# Patient Record
Sex: Male | Born: 1995 | Race: White | Hispanic: No | Marital: Single | Smoking: Current every day smoker
Health system: Southern US, Community
[De-identification: ages and names within clinical notes are randomized; demographics above are authoritative.]

---
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
## 2017-11-03 ENCOUNTER — Emergency Department (HOSPITAL_COMMUNITY): Payer: PRIVATE HEALTH INSURANCE

## 2017-11-03 ENCOUNTER — Other Ambulatory Visit: Payer: Self-pay

## 2017-11-03 ENCOUNTER — Emergency Department (HOSPITAL_COMMUNITY)
Admission: EM | Admit: 2017-11-03 | Discharge: 2017-11-03 | Disposition: A | Discharge status: Home / Self Care | Payer: PRIVATE HEALTH INSURANCE | Attending: Emergency Medicine | Admitting: Emergency Medicine

## 2017-11-03 ENCOUNTER — Encounter (HOSPITAL_COMMUNITY): Payer: Self-pay

## 2017-11-03 DIAGNOSIS — N50812 Left testicular pain: Secondary | ICD-10-CM | POA: Diagnosis present

## 2017-11-03 DIAGNOSIS — F17200 Nicotine dependence, unspecified, uncomplicated: Secondary | ICD-10-CM | POA: Diagnosis not present

## 2017-11-03 DIAGNOSIS — N451 Epididymitis: Secondary | ICD-10-CM

## 2017-11-03 LAB — URINALYSIS, ROUTINE W REFLEX MICROSCOPIC
Bacteria, UA: NONE SEEN
Bilirubin Urine: NEGATIVE
GLUCOSE, UA: NEGATIVE mg/dL
Hgb urine dipstick: NEGATIVE
KETONES UR: NEGATIVE mg/dL
Nitrite: NEGATIVE
PH: 5 (ref 5.0–8.0)
PROTEIN: NEGATIVE mg/dL
Specific Gravity, Urine: 1.025 (ref 1.005–1.030)

## 2017-11-03 MED ORDER — AZITHROMYCIN 250 MG PO TABS
1000.0000 mg | ORAL_TABLET | Freq: Once | ORAL | Status: AC
  Administered 2017-11-03: 1000 mg via ORAL
  Filled 2017-11-03: qty 4

## 2017-11-03 MED ORDER — LIDOCAINE HCL (PF) 1 % IJ SOLN
INTRAMUSCULAR | Status: AC
  Administered 2017-11-03: 0.9 mL
  Filled 2017-11-03: qty 5

## 2017-11-03 MED ORDER — CEFTRIAXONE SODIUM 250 MG IJ SOLR
250.0000 mg | Freq: Once | INTRAMUSCULAR | Status: AC
  Administered 2017-11-03: 250 mg via INTRAMUSCULAR
  Filled 2017-11-03: qty 250

## 2017-11-03 NOTE — ED Provider Notes (Signed)
[facility] EMERGENCY DEPARTMENT Provider Note  CSN: [id number] Arrival date & time: 11/03/17  1337    History   Chief Complaint No chief complaint on file.   HPI Kevin Thompson is a 22 y.o. male with no significant medical history who presented to the ED for left testicle pain x1 day. He reports acute onset sharp pain that began yesterday 11/02/17 at approx. 7pm. He states pain has been constant and unchanged. He denies trauma, injury or recent sexual intercourse. Denies fever, chills, penile discharge, hematuria, dysuria, abdominal pain, N/V, arthralgias or skin rashes. Patient has tried nothing prior to coming to the ED. He reports unprotected sex with male partner ~ 1 month ago.  History reviewed. No pertinent past medical history.  There are no active problems to display for this patient.   History reviewed. No pertinent surgical history.      Home Medications    Prior to Admission medications   Not on File    Family History No family history on file.  Social History Social History   Tobacco Use  . Smoking status: Current Every Day Smoker  . Smokeless tobacco: Never Used  Substance Use Topics  . Alcohol use: Yes  . Drug use: Never     Allergies   Patient has no known allergies.   Review of Systems Review of Systems  Constitutional: Negative for chills and fever.  Gastrointestinal: Negative for abdominal pain, constipation, diarrhea, nausea and vomiting.  Genitourinary: Positive for scrotal swelling and testicular pain. Negative for difficulty urinating, dysuria, flank pain, frequency and hematuria.  Musculoskeletal: Negative.   Skin: Negative.      Physical Exam Updated Vital Signs BP 123/80   Pulse (!) 53   Temp 99.1 F (37.3 C) (Oral)   Resp 16   SpO2 99%   Physical Exam  Constitutional: He appears well-developed and well-nourished. He is cooperative.  Non-toxic appearance. He does not have a sickly appearance.    Sitting on bed comfortably  HENT:  Head: Normocephalic and atraumatic.  Neck: Normal range of motion and full passive range of motion without pain. Neck supple. No edema present.  Abdominal: Soft. Bowel sounds are normal. There is no tenderness. Hernia confirmed negative in the right inguinal area and confirmed negative in the left inguinal area.  Genitourinary: Penis normal. Right testis shows no swelling and no tenderness. Left testis shows swelling and tenderness. Left testis shows no mass. Uncircumcised. No penile erythema.  Genitourinary Comments: Left side of scrotum is erythematous and swollen. Tenderness along posterior aspect of left testicle. No inguinal lymphadenopathy. No penile swelling or discharge.  Musculoskeletal: Normal range of motion.  Lymphadenopathy:    He has no cervical adenopathy. No inguinal adenopathy noted on the right or left side.       Right: No inguinal adenopathy present.       Left: No inguinal adenopathy present.  Neurological: He is alert.  Skin: Skin is warm and intact. Capillary refill takes less than 2 seconds. No rash noted.  Nursing note and vitals reviewed.  ED Treatments / Results  Labs (all labs ordered are listed, but only abnormal results are displayed) Labs Reviewed  URINALYSIS, ROUTINE W REFLEX MICROSCOPIC - Abnormal; Notable for the following components:      Result Value   Leukocytes, UA SMALL (*)    All other components within normal limits  GC/CHLAMYDIA PROBE AMP ([place]) NOT AT [facility]    EKG None  Radiology US Scrotum W/doppler  Result Date: 11/03/2017 CLINICAL DATA:  Left-sided testicular pain. Pain began last evening. Initial encounter. EXAM: SCROTAL ULTRASOUND DOPPLER ULTRASOUND OF THE TESTICLES TECHNIQUE: Complete ultrasound examination of the testicles, epididymis, and other scrotal structures was performed. Color and spectral Doppler ultrasound were also utilized to evaluate blood flow to the testicles. COMPARISON:   None. FINDINGS: Right testicle Measurements: 4.5 x 2.0 x 3.8 cm, within normal limits. No mass or microlithiasis visualized. Left testicle Measurements: 4.2 x 2.5 x 2.7 cm, within normal limits. No mass or microlithiasis visualized. Right epididymis:  Normal in size and appearance. Left epididymis: A benign 4 mm cyst is present tail of the left epididymis. There is increased vascularity of the right epididymis. No other mass lesion is present. Hydrocele:  Minimal fluid is present about both testicles. Varicocele:  None visualized. Pulsed Doppler interrogation of both testes demonstrates normal low resistance arterial and venous waveforms bilaterally. IMPRESSION: 1. Hyperemia of the left epididymis raising concern for epididymitis. This may account for the left-sided testicular pain. 2. Normal appearance of the testicles bilaterally. 3. Normal pulsed Doppler interrogation with normal arterial and venous waveforms bilaterally. Electronically Signed   By: Marin Robertshristopher  Mattern M.D.   On: 11/03/2017 15:25    Procedures Procedures (including critical care time)  Medications Ordered in ED Medications  cefTRIAXone (ROCEPHIN) injection 250 mg (250 mg Intramuscular Given 11/03/17 1740)  azithromycin (ZITHROMAX) tablet 1,000 mg (1,000 mg Oral Given 11/03/17 1739)  lidocaine (PF) (XYLOCAINE) 1 % injection (0.9 mLs  Given 11/03/17 1740)    Initial Impression / Assessment and Plan / ED Course  Triage vital signs and the nursing notes have been reviewed.  Pertinent labs & imaging results that were available during care of the patient were reviewed and considered in medical decision making (see chart for details).  Patient presents afebrile and is well appearing for acute onset left testicular pain. Pain began > 12 hours before presentation to the ED. Patient is sitting comfortably in the exam room and does not endorse testicular pain except during palpation of posterior aspect of left teste. Denies recent sexual  intercourse, penile drainage, lymphadenopathy, arthralgias or skin rashes. Highest concern for torsion even though it is beyond the time frame to salvage teste if torsed. Given demographic, also concerned for STI. Urethral swab performed for gonorrhea/chlamydia.  Clinical Course as of Nov 03 1817  Fri Nov 03, 2017  1400 UA not suggestive of UTI.   [GM]  1636 Scrotum ultrasound showed no torsion. Hyperemia of left epididymitis concerning for epididymitis. No free fluid or abscess visualized.   [GM]  1728 IM Rocephin 250mg   + Zithromax 1000mg  x1 given for treatment of epididymitis.  Patient refused HIV and RPR testing.   [GM]    Clinical Course User Index [GM] Mortis, Sharyon MedicusGabrielle I, PA-C    Final Clinical Impressions(s) / ED Diagnoses  1. Epididymitis. IM Rocephin 250mg  + Zithromax 1000mg  x1 given in the ED for treatment. Education provided on OTC and supportive options for relief. Advised to follow-up with PCP.  Dispo: Home. After thorough clinical evaluation, this patient is determined to be medically stable and can be safely discharged with the previously mentioned treatment and/or outpatient follow-up/referral(s). At this time, there are no other apparent medical conditions that require further screening, evaluation or treatment.   Final diagnoses:  Epididymitis    ED Discharge Orders    None        Reva BoresMortis, Gabrielle I, PA-C 11/03/17 1819    Vanetta MuldersZackowski, Scott, MD 11/12/17 [phone]

## 2017-11-03 NOTE — Discharge Instructions (Signed)
Your ultrasound showed that your testicle was no twisted (or torsed). There is swelling in the epididymitis. The most common bacteria that cause this are gonorrhea and chlamydia. You have received treatment for both of those today. You do not need any additional antibiotics after today.

## 2017-11-03 NOTE — ED Provider Notes (Signed)
Patient placed in Quick Look pathway, seen and evaluated   Chief Complaint: Left testicle pain  HPI:   Reports that since yesterday at around 7pm  he has had left sided testicle pain.  He reports that it is lower than the normal.  He denies any burning, increased frequency or urgency.  No fevers or chills.  He says that he kept checking it yesterday but "tugging on it" and that would shoot pain up into his pelvis. No tylenol or ibuprofen PTA.   ROS: No dysuria, hematuria, no flank pain.   Physical Exam:   Gen: No distress  Neuro: Awake and Alert  Skin: Warm    Focused Exam: Deferred.   Initiation of care has begun. The patient has been counseled on the process, plan, and necessity for staying for the completion/evaluation, and the remainder of the medical screening examination    Norman ClayHammond, Damoni Erker W, PA-C 11/03/17 1346    Sabas SousBero, Michael M, MD 11/03/17 [phone]

## 2017-11-03 NOTE — ED Triage Notes (Signed)
Patient complains of left testicle pain and mild swelling x 1 day, denies trauma. denies discharge. States that he thinks his left testicle seems to be hanging lower

## 2017-11-06 LAB — GC/CHLAMYDIA PROBE AMP (~~LOC~~) NOT AT ARMC
CHLAMYDIA, DNA PROBE: POSITIVE — AB
Neisseria Gonorrhea: NEGATIVE

## 2017-12-12 ENCOUNTER — Ambulatory Visit (INDEPENDENT_AMBULATORY_CARE_PROVIDER_SITE_OTHER): Payer: PRIVATE HEALTH INSURANCE

## 2017-12-12 ENCOUNTER — Encounter: Payer: Self-pay | Admitting: Family Medicine

## 2017-12-12 ENCOUNTER — Ambulatory Visit (INDEPENDENT_AMBULATORY_CARE_PROVIDER_SITE_OTHER): Payer: PRIVATE HEALTH INSURANCE | Admitting: Family Medicine

## 2017-12-12 VITALS — BP 134/72 | HR 57 | Ht 71.0 in | Wt 199.0 lb

## 2017-12-12 DIAGNOSIS — M533 Sacrococcygeal disorders, not elsewhere classified: Secondary | ICD-10-CM

## 2017-12-12 MED ORDER — NAPROXEN 500 MG PO TABS: 500.0000 mg | ORAL_TABLET | Freq: Two times a day (BID) | ORAL | 0 refills | Status: AC | PRN

## 2017-12-12 MED ORDER — DICLOFENAC SODIUM 1 % TD GEL: 2.0000 g | Freq: Four times a day (QID) | TRANSDERMAL | 11 refills | Status: AC

## 2017-12-12 NOTE — Patient Instructions (Addendum)
Thank you for coming in today. Take naproxen twice daily as needed for pain.  Use doughnut cushion. Apply the diclofenac gel to the butt crack up to 4x daily for pain.  Recheck in a few weeks if not improving.  Return sooner if really bad or worsening.    Tailbone Injury The tailbone (coccyx) is the small bone at the lower end of the spine. A tailbone injury may involve stretched ligaments, bruising, or a broken bone (fracture). Tailbone injuries can be painful, and some may take a long time to heal. What are the causes? This condition is often caused by falling and landing on the tailbone. Other causes include:  Repeated strain or friction from actions such as rowing and bicycling.  Childbirth.  In some cases, the cause may not be known. What increases the risk? This condition is more common in women than in men. What are the signs or symptoms? Symptoms of this condition include:  Pain in the lower back, especially when sitting.  Pain or difficulty when standing up from a sitting position.  Bruising in the tailbone area.  Painful bowel movements.  In women, pain during intercourse.  How is this diagnosed? This condition may be diagnosed based on your symptoms and a physical exam. X-rays may be taken if a fracture is suspected. You may also have other tests, such as a CT scan or MRI. How is this treated? This condition may be treated with medicines to help relieve your pain. Most tailbone injuries heal on their own in 4-6 weeks. However, recovery time may be longer if the injury involves a fracture. Follow these instructions at home:  Take medicines only as directed by your health care provider.  If directed, apply ice to the injured area: ? Put ice in a plastic bag. ? Place a towel between your skin and the bag. ? Leave the ice on for 20 minutes, 2-3 times per day for the first 1-2 days.  Sit on a large, rubber or inflated ring or cushion to ease your pain. Lean forward  when you are sitting to help decrease discomfort.  Avoid sitting for long periods of time.  Increase your activity as the pain allows. Perform any exercises that are recommended by your health care provider or physical therapist.  If you have pain during bowel movements, use stool softeners as directed by your health care provider.  Eat a diet that includes plenty of fiber to help prevent constipation.  Keep all follow-up visits as directed by your health care provider. This is important. How is this prevented? Wear appropriate padding and sports gear when bicycling and rowing. This can help to prevent developing an injury that is caused by repeated strain or friction. Contact a health care provider if:  Your pain becomes worse.  Your bowel movements cause a great deal of discomfort.  You are unable to have a bowel movement.  You have uncontrolled urine loss (urinary incontinence).  You have a fever. This information is not intended to replace advice given to you by your health care provider. Make sure you discuss any questions you have with your health care provider. Document Released: 02/26/2000 Document Revised: 10/29/2015 Document Reviewed: 02/24/2014 Elsevier Interactive Patient Education  [employer].

## 2017-12-12 NOTE — Progress Notes (Signed)
Kevin Thompson is a 22 y.o. male who presents to [facility] today for tailbone pain.  Yesterday he tripped down the stairs and landed on his tailbone. He had immediate pain that is worse with sitting and better with standing and moving around. The pain kept him up last night. He has tried taking Advil but it did not provide any relief. 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    ROS:  No headache, visual changes, nausea, vomiting, diarrhea, constipation, dizziness, abdominal pain, skin rash, fevers, chills, night sweats, weight loss, swollen lymph nodes, body aches, joint swelling, muscle aches, chest pain, shortness of breath, mood changes, visual or auditory hallucinations.    Exam:  BP 134/72   Pulse (!) 57   Ht 5\' 11"  (1.803 m)   Wt 199 lb (90.3 kg)   BMI 27.75 kg/m  General: Well Developed, well nourished, and in no acute distress.  Neuro/Psych: Alert and oriented x3, extra-ocular muscles intact, able to move all 4 extremities, sensation grossly intact. Skin: Warm and dry, no rashes noted.  Respiratory: Not using accessory muscles, speaking in full sentences, trachea midline.  Cardiovascular: Pulses palpable, no extremity edema. Abdomen: Does not appear distended. Sacrum/coccyx: normal appearing with no obvious deformities  Extremely tender to palpation over the sacral and coccygeal midline     Lab and Radiology Results No results found for this or any previous visit (from the past 72 hour(s)). Dg Sacrum/coccyx  Result Date: 12/12/2017 CLINICAL DATA:  Coccygeal region pain following a fall yesterday EXAM: SACRUM AND COCCYX - 2+ VIEW COMPARISON:  None. FINDINGS: The sacrum is subjectively adequately mineralized. At least 4 intact sacral struts are visible. The SI joints are grossly normal. On the lateral view there is cortical irregularity of the proximal aspect of the first coccygeal  segment consistent with an acute fracture. There is angulation of the distal coccygeal segment anteriorly with respect to the middle coccygeal segment that is likely chronic. IMPRESSION: Findings consistent with an acute fracture of the first coccygeal segment. Electronically Signed   By: David  [place] M.D.   On: 12/12/2017 11:13  I personally (independently) visualized and performed the interpretation of the images attached in this note.     Assessment and Plan: 22 y.o. male with coccygeal pain following a fall down the stairs. Coccyx fracture seen on xray. . The plan will be to take naproxen and apply diclofenac gel to the painful area. He was also advised to get a donut shaped cushion to offload pressure from the painful area. Recheck in 2-3 weeks.  He was advised that this will take several weeks to heal and he likely will miss work for a few days until the pain improves a bit.     Orders Placed This Encounter  Procedures  . DG Sacrum/Coccyx    Standing Status:   Future    Number of Occurrences:   1    Standing Expiration Date:   02/12/2019    Order Specific Question:   Reason for Exam (SYMPTOM  OR DIAGNOSIS REQUIRED)    Answer:   fall coccydynia    Order Specific Question:   Preferred imaging location?    Answer:   [facility]    Order Specific Question:   Radiology Contrast Protocol - do NOT remove file path    Answer:   [email or web address]   Meds ordered this encounter  Medications  .  naproxen (NAPROSYN) 500 MG tablet    Sig: Take 1 tablet (500 mg total) by mouth 2 (two) times daily as needed for moderate pain.    Dispense:  60 tablet    Refill:  0  . diclofenac sodium (VOLTAREN) 1 % GEL    Sig: Apply 2 g topically 4 (four) times daily. To affected joint.    Dispense:  100 g    Refill:  11    Historical information moved to improve visibility of documentation.  History reviewed. No pertinent past medical history. History  reviewed. No pertinent surgical history. Social History   Tobacco Use  . Smoking status: Current Every Day Smoker  . Smokeless tobacco: Never Used  Substance Use Topics  . Alcohol use: Yes   family history is not on file.  Medications: Current Outpatient Medications  Medication Sig Dispense Refill  . diclofenac sodium (VOLTAREN) 1 % GEL Apply 2 g topically 4 (four) times daily. To affected joint. 100 g 11  . naproxen (NAPROSYN) 500 MG tablet Take 1 tablet (500 mg total) by mouth 2 (two) times daily as needed for moderate pain. 60 tablet 0   No current facility-administered medications for this visit.    No Known Allergies    Discussed warning signs or symptoms. Please see discharge instructions. Patient expresses understanding.  I personally was present and performed or re-performed the history, physical exam and medical decision-making activities of this service and have verified that the service and findings are accurately documented in the student's note. ___________________________________________ Clementeen Graham M.D., ABFM., CAQSM. Primary Care and Sports Medicine Adjunct Instructor of Family Medicine  [employer]

## 2019-06-01 IMAGING — DX DG SACRUM/COCCYX 2+V
3 series · 3 of 3 positions shown · non-contrast
Comparison: None.

CLINICAL DATA: Coccygeal region pain following a fall yesterday

EXAM:
SACRUM AND COCCYX - 2+ VIEW

[coccyx ap]
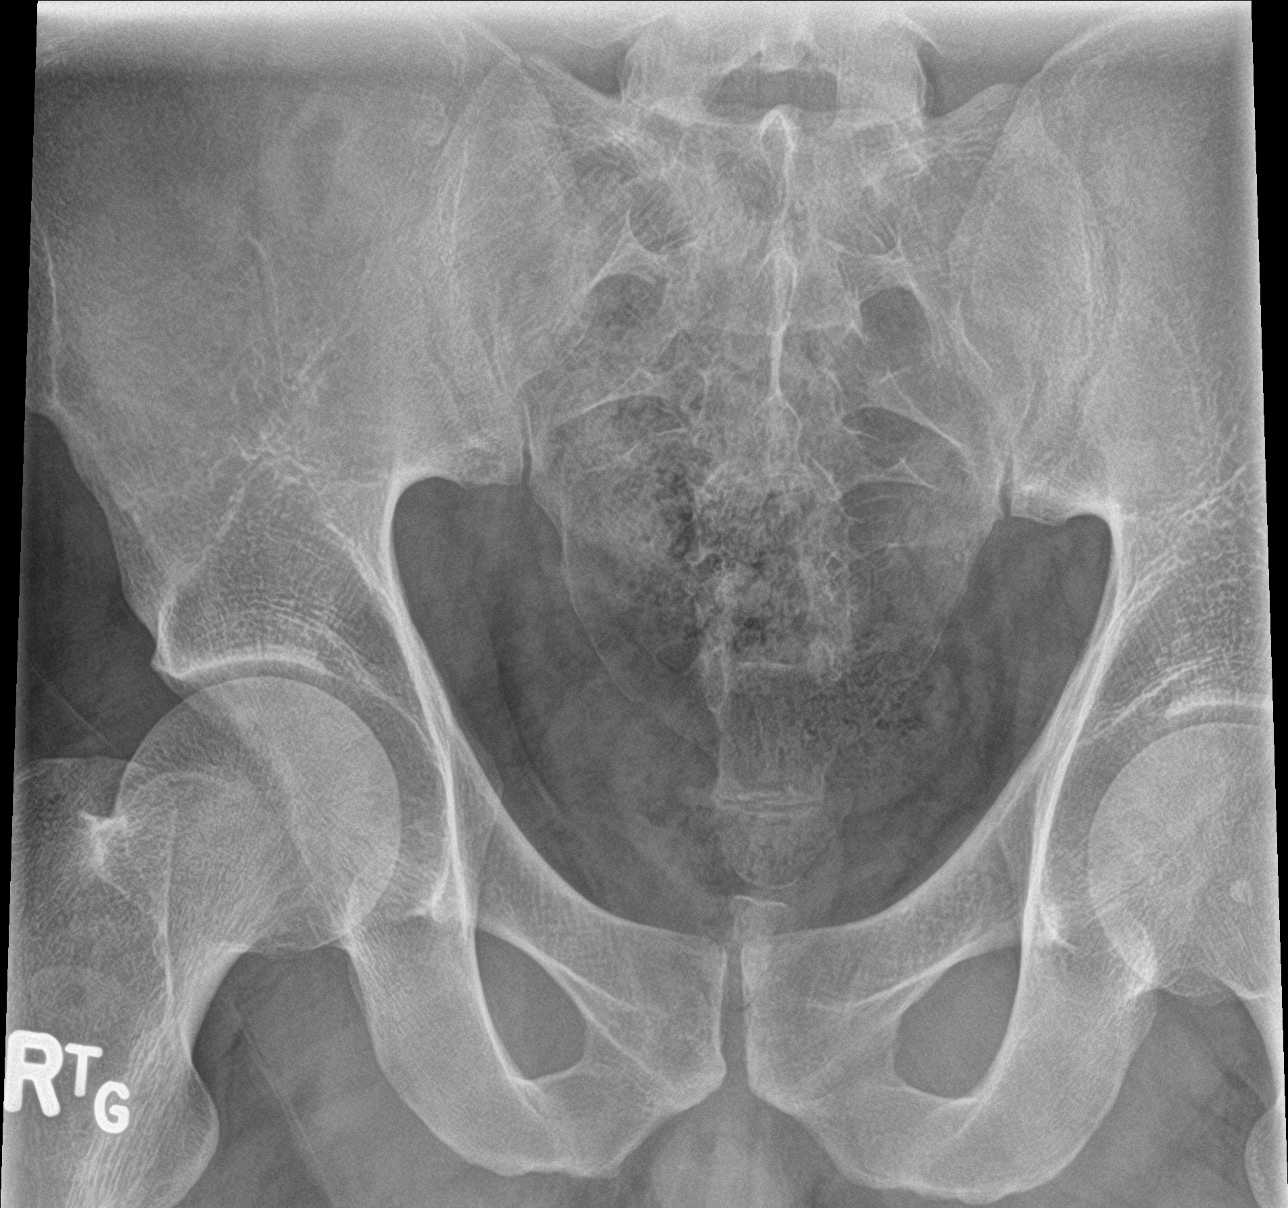

[sacrum ap]
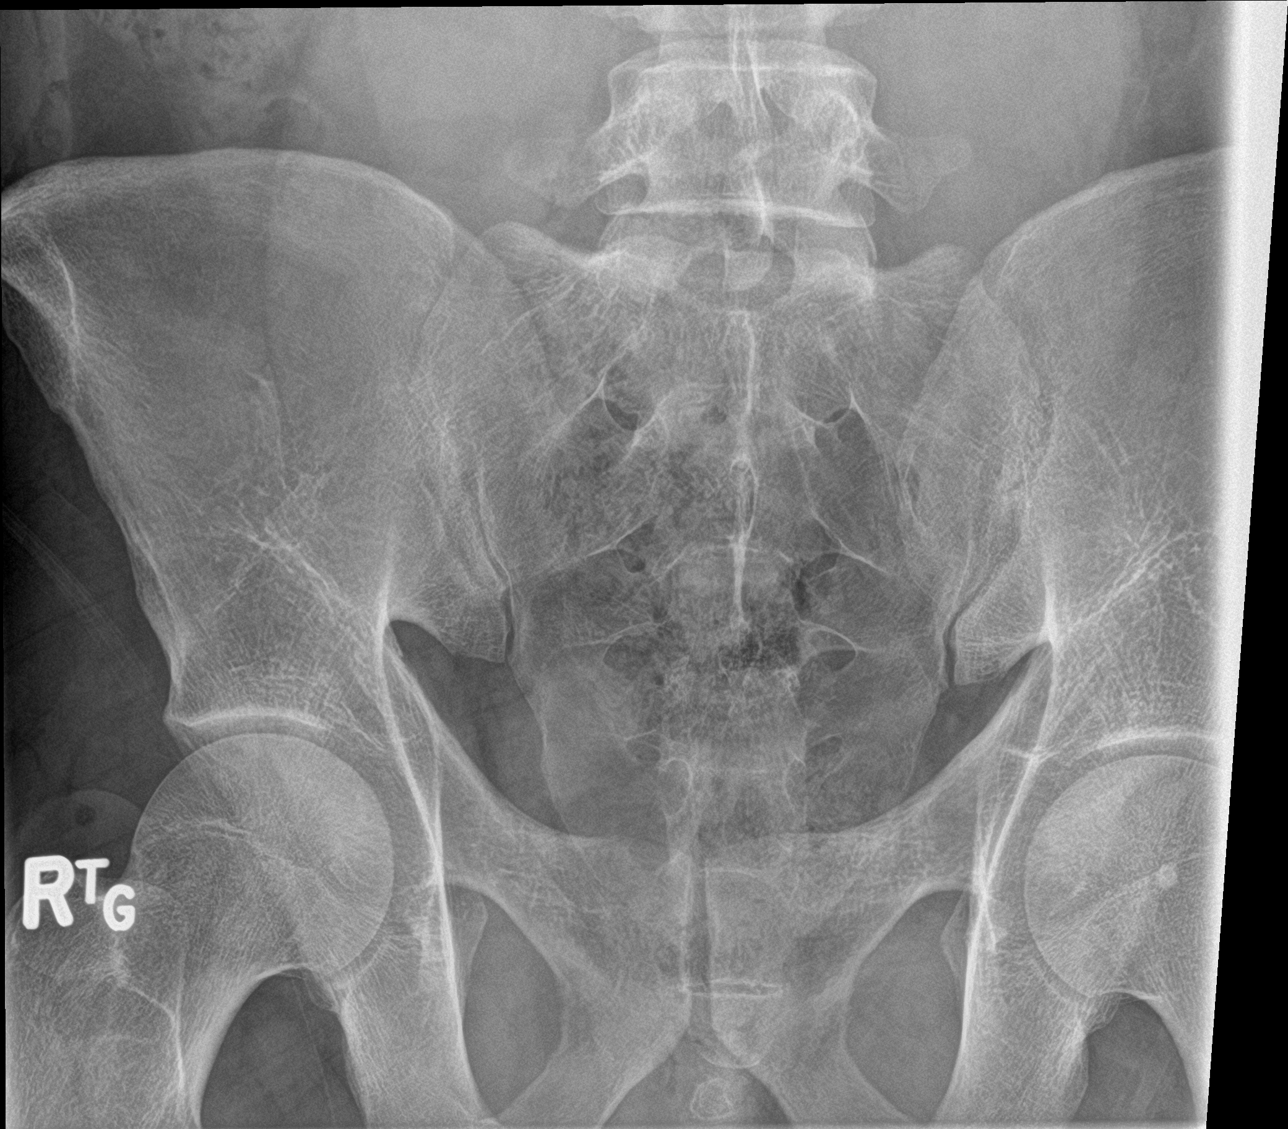

[sacrum lat]
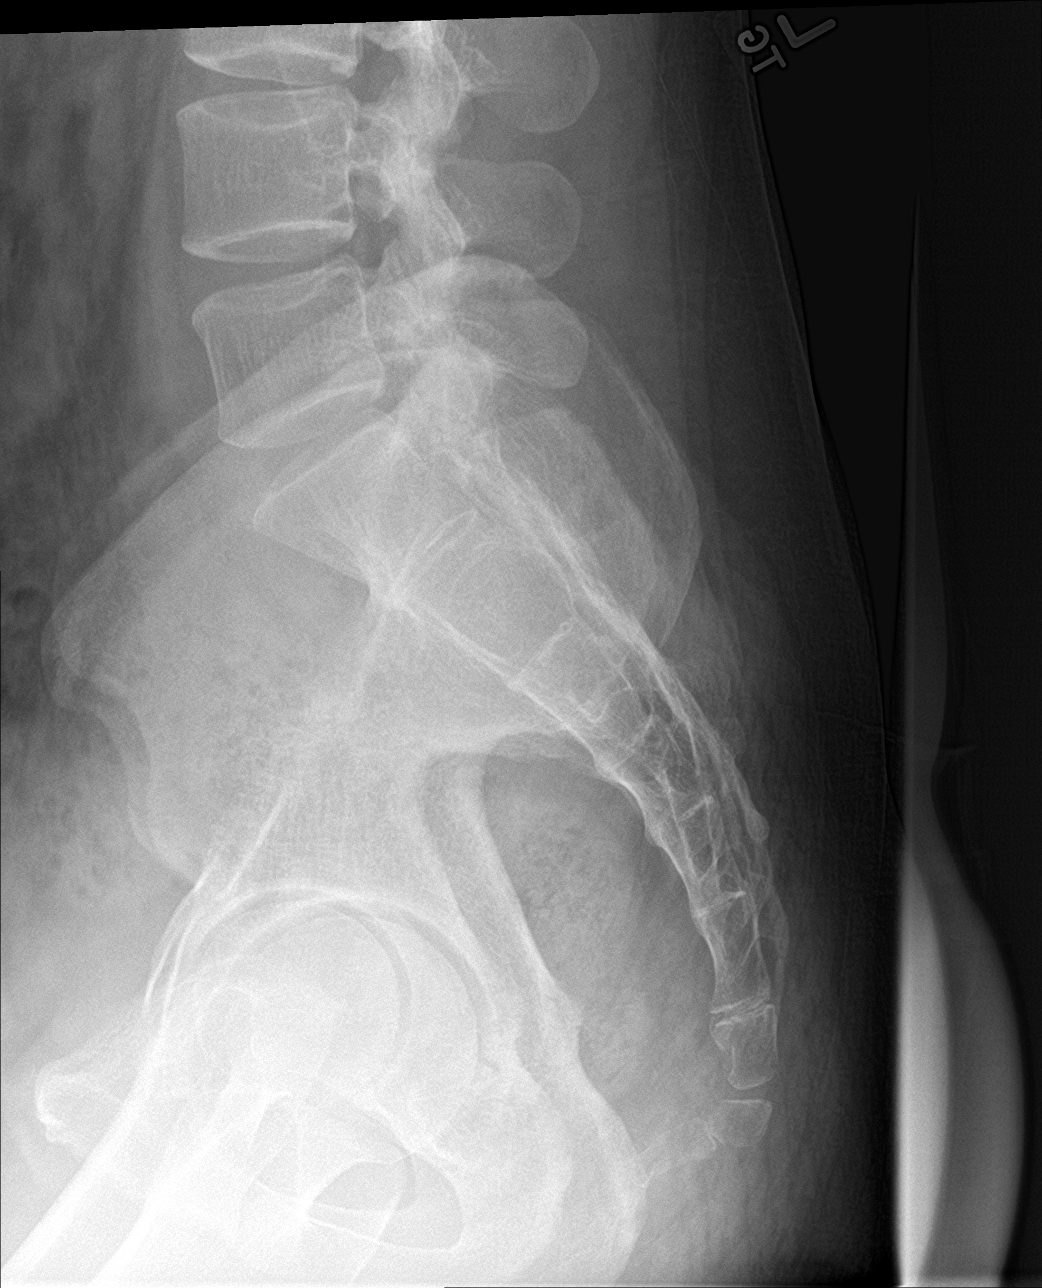

[3 of 3 positions shown; findings below may reference images not displayed]

FINDINGS: The sacrum is subjectively adequately mineralized. At least 4 intact
sacral struts are visible. The SI joints are grossly normal. On the
lateral view there is cortical irregularity of the proximal aspect
of the first coccygeal segment consistent with an acute fracture.
There is angulation of the distal coccygeal segment anteriorly with
respect to the middle coccygeal segment that is likely chronic..
IMPRESSION: Findings consistent with an acute fracture of the first coccygeal
segment.

## 2019-10-03 IMAGING — US US SCROTUM W/ DOPPLER COMPLETE
1 series · 13 of 25 positions shown · non-contrast
Comparison: None.

CLINICAL DATA: Left-sided testicular pain. Pain began last evening.
Initial encounter.

EXAM:
SCROTAL ULTRASOUND
DOPPLER ULTRASOUND OF THE TESTICLES
TECHNIQUE: Complete ultrasound examination of the testicles, epididymis, and
other scrotal structures was performed. Color and spectral Doppler
ultrasound were also utilized to evaluate blood flow to the
testicles.

[Series 1: us scrotum w/ doppler complete · 0.06mm/px · 13 of 88 slices shown]
[im 1/88]
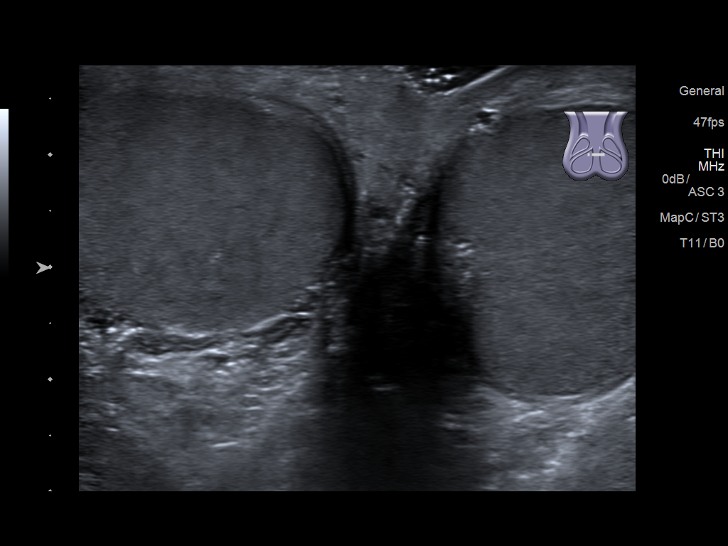
[im 8/88]
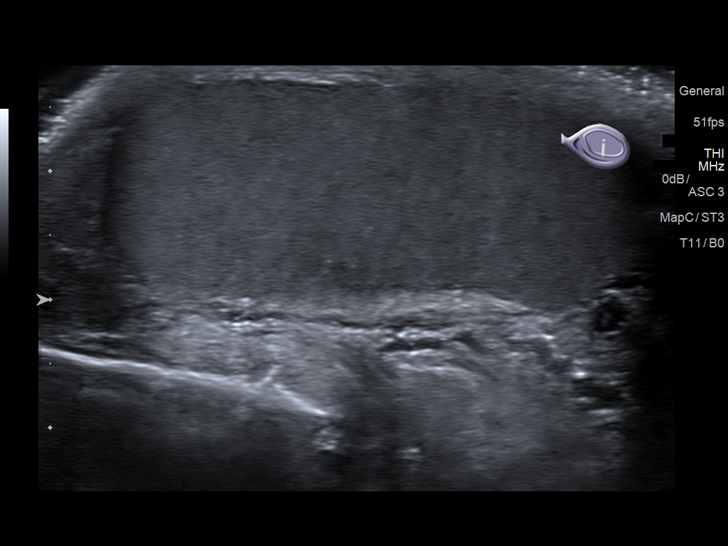
[im 15/88]
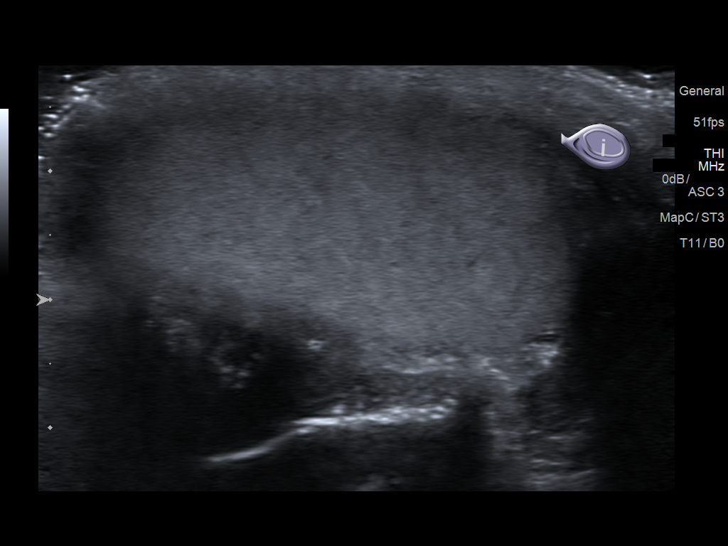
[im 22/88]
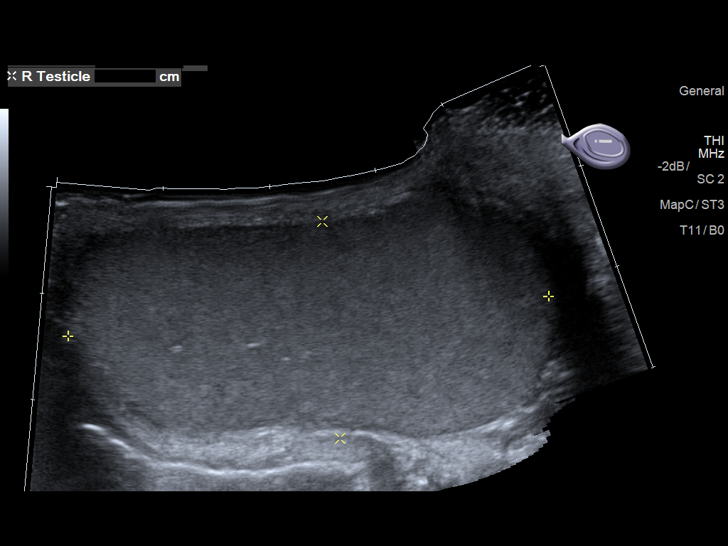
[im 30/88]
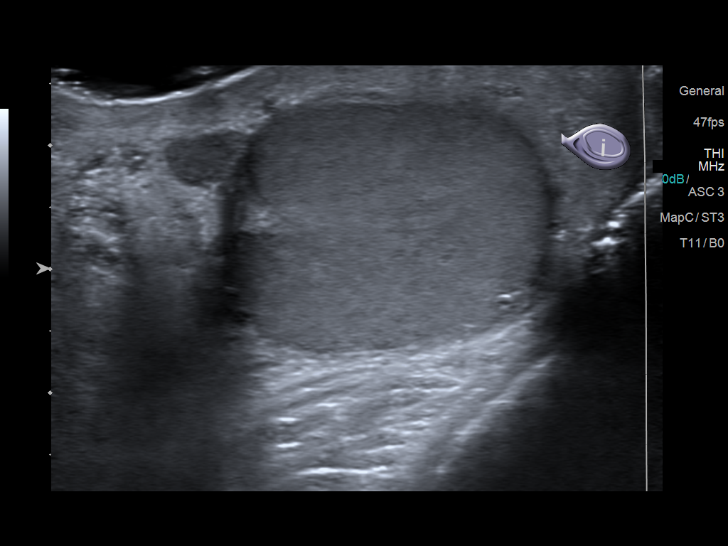
[im 37/88]
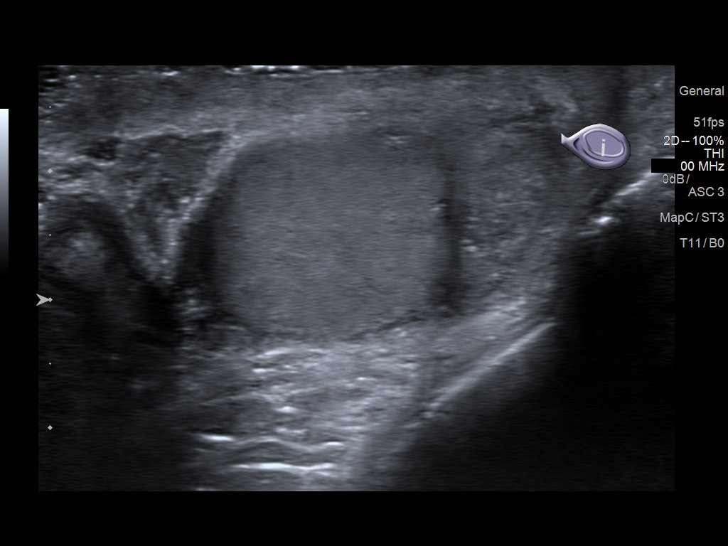
[im 44/88]
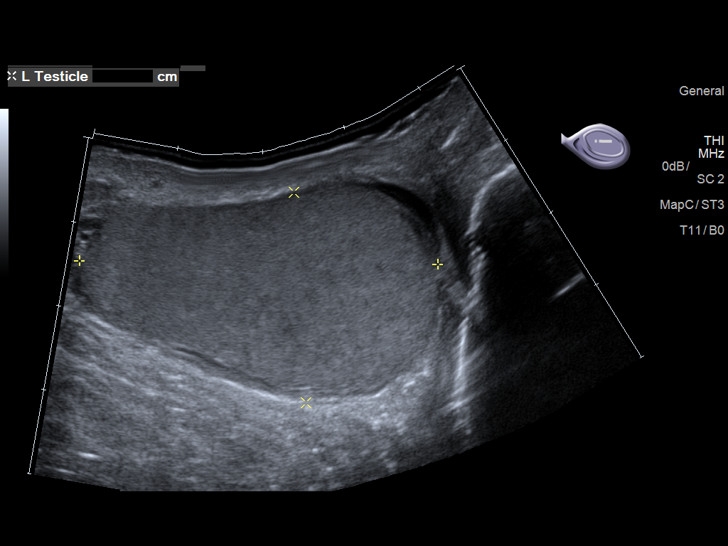
[im 51/88]
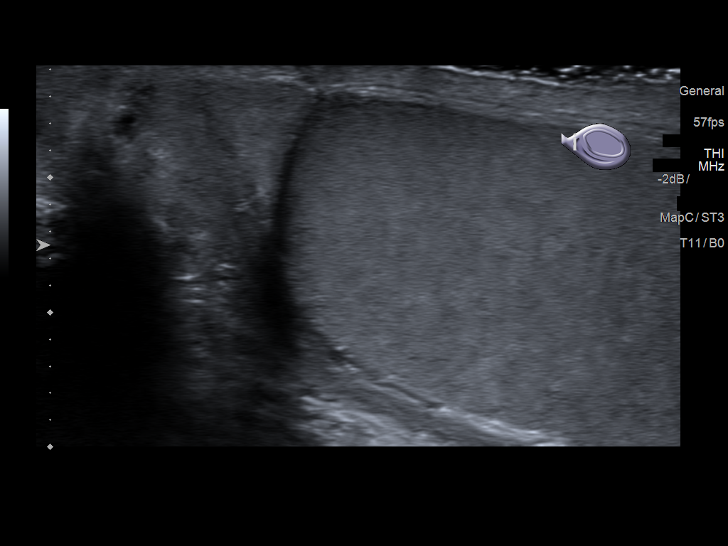
[im 59/88]
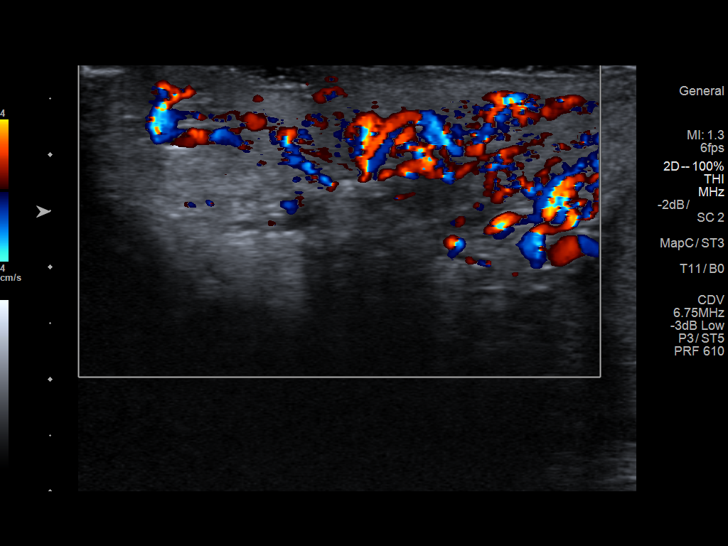
[im 66/88]
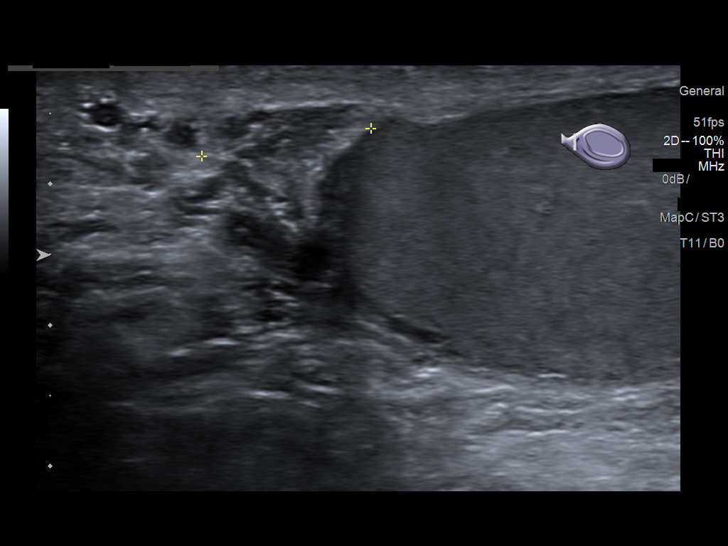
[im 73/88]
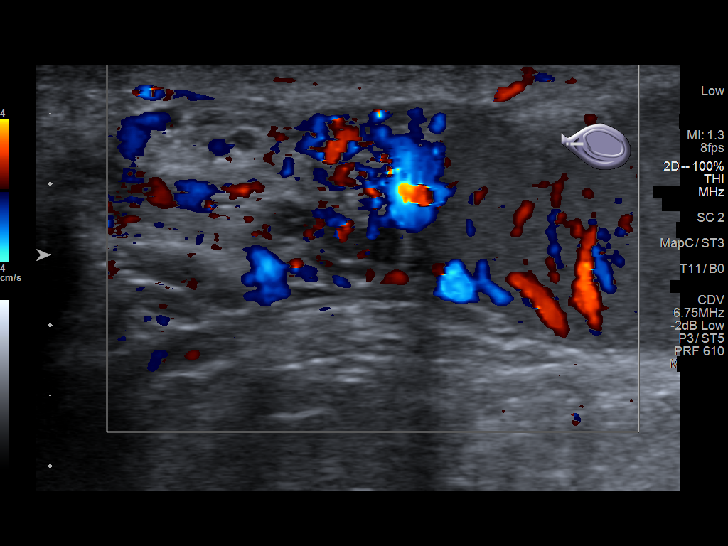
[im 80/88]
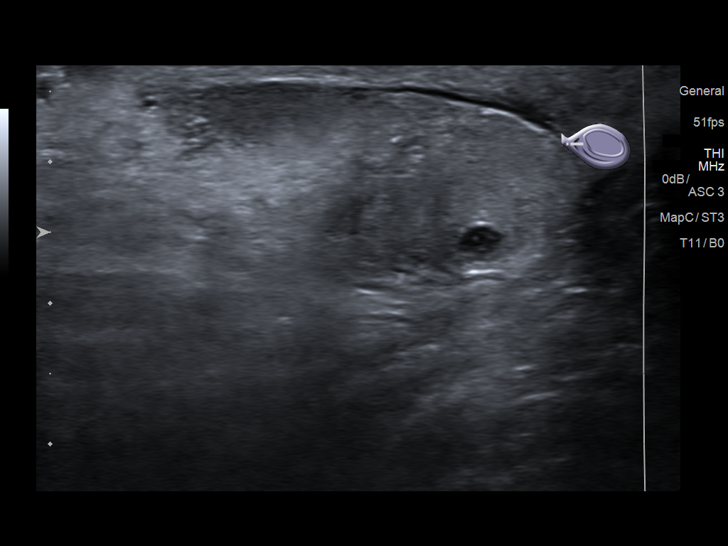
[im 88/88]
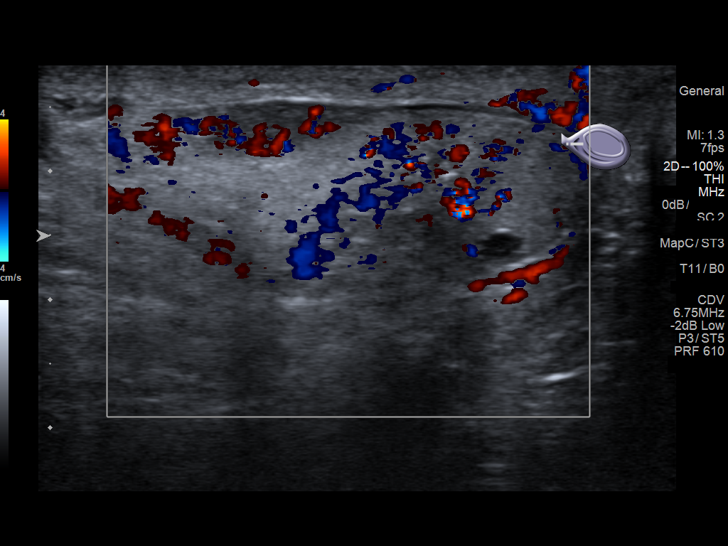

[13 of 25 positions shown; findings below may reference images not displayed]

FINDINGS: Right testicle

Measurements: 4.5 x 2.0 x 3.8 cm, within normal limits. No mass or
microlithiasis visualized.

Left testicle

Measurements: 4.2 x 2.5 x 2.7 cm, within normal limits.. No mass or
microlithiasis visualized.

Right epididymis:  Normal in size and appearance.

Left epididymis: A benign 4 mm cyst is present tail of the left
epididymis. There is increased vascularity of the right epididymis.
No other mass lesion is present.

Hydrocele:  Minimal fluid is present about both testicles.

Varicocele:  None visualized.

Pulsed Doppler interrogation of both testes demonstrates normal low
resistance arterial and venous waveforms bilaterally.
IMPRESSION: 1. Hyperemia of the left epididymis raising concern for
epididymitis. This may account for the left-sided testicular pain.
2. Normal appearance of the testicles bilaterally.
3. Normal pulsed Doppler interrogation with normal arterial and
venous waveforms bilaterally.
